# Patient Record
Sex: Male | Born: 1940 | Race: White | Hispanic: No | Marital: Married | Smoking: Never smoker
Health system: Southern US, Community
[De-identification: ages and names within clinical notes are randomized; demographics above are authoritative.]

---

## 2009-04-30 ENCOUNTER — Emergency Department (HOSPITAL_BASED_OUTPATIENT_CLINIC_OR_DEPARTMENT_OTHER): Admission: EM | Admit: 2009-04-30 | Discharge: 2009-04-30 | Payer: Self-pay | Admitting: Emergency Medicine

## 2009-04-30 ENCOUNTER — Ambulatory Visit: Payer: Self-pay | Admitting: Diagnostic Radiology

## 2011-04-07 IMAGING — CR DG KNEE COMPLETE 4+V*L*
4 series · 4 of 4 positions shown · non-contrast
Comparison: None

CLINICAL DATA: left leg injury

LEFT KNEE - COMPLETE 4+ VIEW

[t knee ap left]
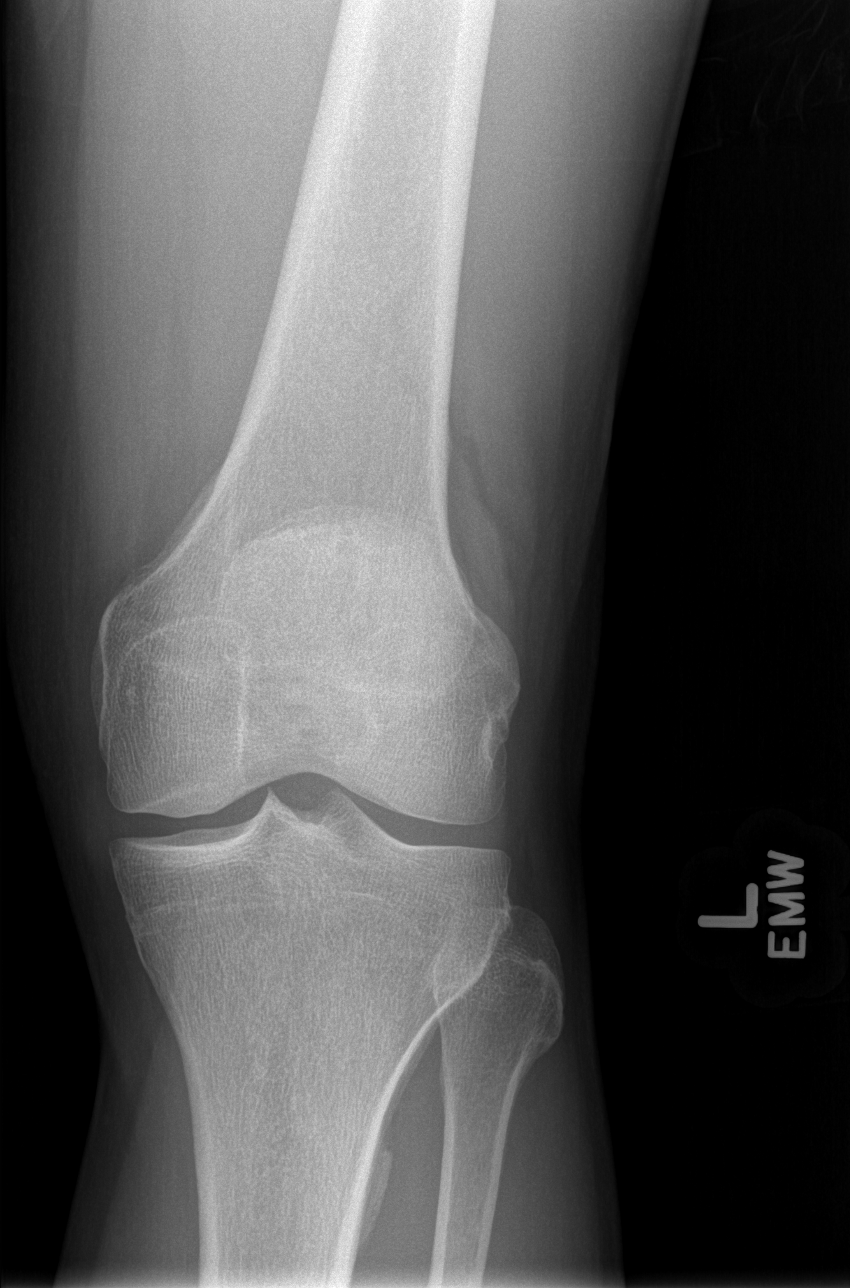

[t knee oblique left (1 of 2)]
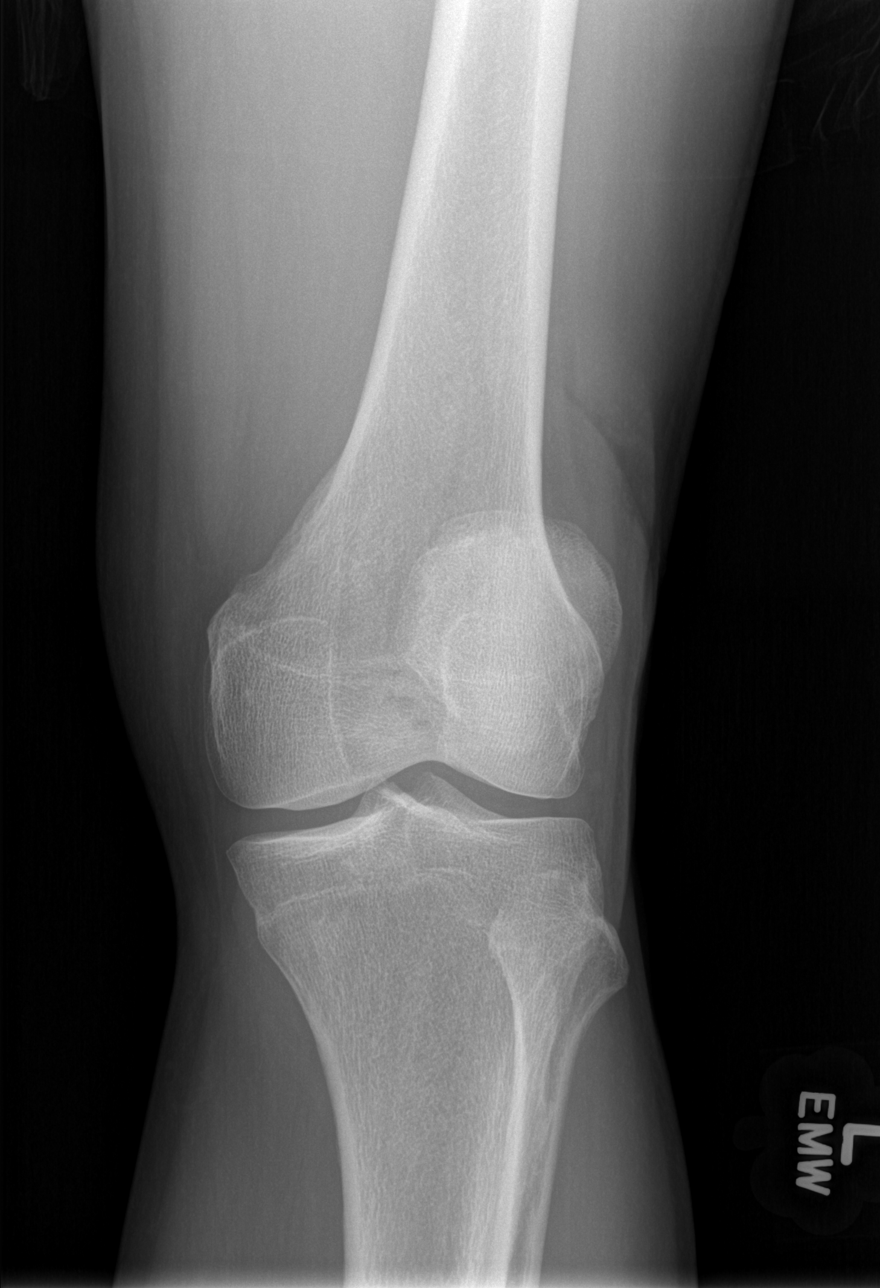

[t knee oblique left (2 of 2)]
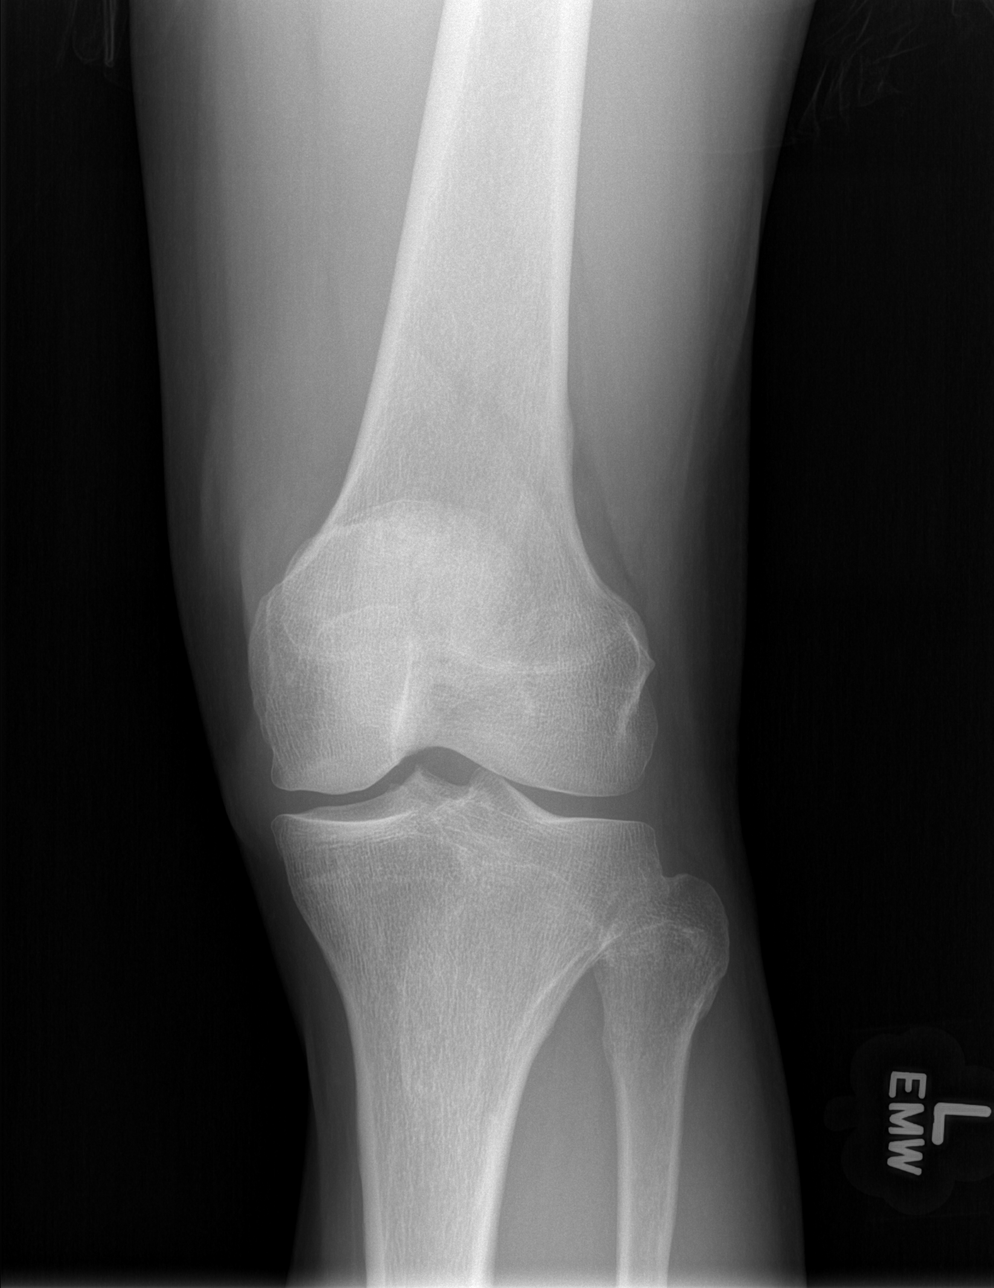

[t knee lat left]
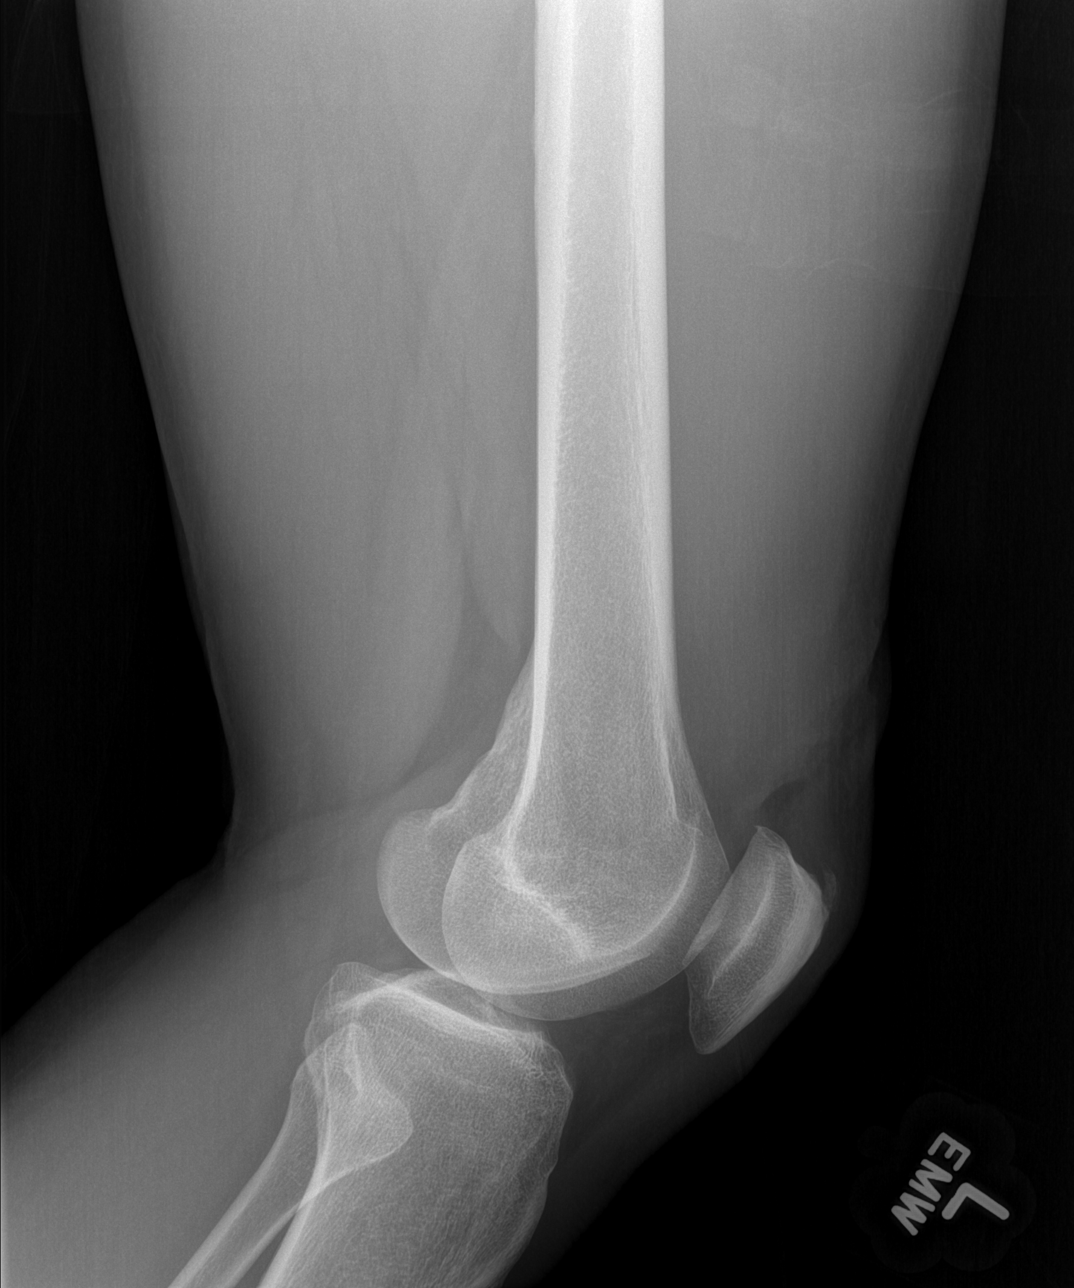

[4 of 4 positions shown; findings below may reference images not displayed]

FINDINGS: Four views of the left knee submitted.  No acute fracture
or subluxation.  Soft tissue irregularity probable laceration noted
suprapatellar region.
IMPRESSION: No acute fracture or subluxation.  Soft tissue irregularity
probable laceration noted suprapatellar region.

## 2019-12-21 ENCOUNTER — Ambulatory Visit (INDEPENDENT_AMBULATORY_CARE_PROVIDER_SITE_OTHER): Payer: Medicare Other | Admitting: Orthopaedic Surgery

## 2019-12-21 ENCOUNTER — Encounter: Payer: Self-pay | Admitting: Orthopaedic Surgery

## 2019-12-21 ENCOUNTER — Ambulatory Visit: Payer: Self-pay

## 2019-12-21 ENCOUNTER — Other Ambulatory Visit: Payer: Self-pay

## 2019-12-21 VITALS — Ht 65.0 in | Wt 150.0 lb

## 2019-12-21 DIAGNOSIS — M25551 Pain in right hip: Secondary | ICD-10-CM | POA: Diagnosis not present

## 2019-12-21 DIAGNOSIS — M25561 Pain in right knee: Secondary | ICD-10-CM | POA: Diagnosis not present

## 2019-12-21 DIAGNOSIS — G8929 Other chronic pain: Secondary | ICD-10-CM

## 2019-12-21 NOTE — Progress Notes (Signed)
Office Visit Note   Patient: Thomas Keller           Date of Birth: [birth year]           MRN: [id number] Visit Date: 12/21/2019              Requested by: No referring provider defined for this encounter. PCP: Thomas Bonito, MD   Assessment & Plan: Visit Diagnoses:  1. Chronic pain of right knee   2. Pain in right hip   3. 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  Presently he is doing quite well without knee effusion or significant pain.  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 this could at be contributing to some of his thigh pain.  At any rate he is doing well at this point. we talked about using over-the-counter medicines and activities as tolerated.  We could always consider cortisone injection in his knee if it recurs  Follow-Up Instructions: Return if symptoms worsen or fail to improve.   Orders:  Orders Placed This Encounter  Procedures  . XR KNEE 3 VIEW RIGHT  . XR Pelvis 1-2 Views   No orders of the defined types were placed in this encounter.     Procedures: No procedures performed   Clinical Data: No additional findings.   Subjective: Chief Complaint  Patient presents with  . Right Knee - Pain  Patient presents today for right knee pain. No known injury. His pain started 6-8 weeks ago. His pain is located all throughout his knee. The pain is not constant, but seems worse at night. He also has more pain upon getting up after resting. No swelling, popping, or giving way. He states that it would grind when the pain initially started, but has now stopped grinding. He was instructed to take Advil by his PCP, but it didn't help. No previous right knee surgery.  His pain has abated significantly over the past several days and he is functioning without any discomfort.  Occasionally he has some thigh pain and  with some groin discomfort no related back pain.  No numbness or tingling  HPI  Review of Systems   Objective: Vital Signs: Ht 5\' 5"  (1.651 m)   Wt 150 lb (68 kg)   BMI 24.96 kg/m   Physical Exam Constitutional:      Appearance: He is well-developed.  Eyes:     Pupils: Pupils are equal, round, and reactive to light.  Pulmonary:     Effort: Pulmonary effort is normal.  Skin:    General: Skin is warm and dry.  Neurological:     Mental Status: He is alert and oriented to person, place, and time.  Psychiatric:        Behavior: Behavior normal.     Ortho Exam awake alert and oriented x3.  Comfortable sitting.  Right knee was not hot red warm or swollen.  No effusion.  No localized area of tenderness.  Very mild patellar crepitation.  No distal edema.  Motor exam intact.  Does have some mild loss of external rotation of both of his hips.  I did obtain films of his pelvis and he has mild arthritis  Specialty Comments:  No specialty comments available.  Imaging: XR KNEE 3 VIEW RIGHT  Result Date: 12/21/2019  3 films of the right knee are obtained in the standing projection.  There are very minimal degenerative changes.  The joint space is well-maintained with no peripheral osteophytes.  Some mild patellofemoral arthritis but the joint spaces are well-maintained and no tilting.  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  XR Pelvis 1-2 Views  Result Date: 12/21/2019 AP pelvis and lateral of the right hip are obtained demonstrating some calcification of the bilateral hip capsules.  There is some calcification along the lateral acetabular lips.  The joint spaces are well-maintained and no irregularity of the femoral heads.  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 Pain in right knee 12/21/2019  . Pain in right hip 12/21/2019   History reviewed. No  pertinent past medical history.  History reviewed. No pertinent family history.  History reviewed. No pertinent surgical history. Social History   Occupational History  . Not on file  Tobacco Use  . Smoking status: Never Smoker  . Smokeless tobacco: Never Used  Substance and Sexual Activity  . Alcohol use: Never  . Drug use: Never  . Sexual activity: Not on file

## 2024-10-18 ENCOUNTER — Ambulatory Visit: Admitting: Orthopaedic Surgery

## 2024-10-19 ENCOUNTER — Ambulatory Visit: Admitting: Orthopaedic Surgery

## 2024-11-08 ENCOUNTER — Ambulatory Visit: Admitting: Orthopaedic Surgery
